# Patient Record
Sex: Male | Born: 1985 | Race: Black or African American | Hispanic: No | Marital: Single | Smoking: Former smoker
Health system: Southern US, Community
[De-identification: ages and names within clinical notes are randomized; demographics above are authoritative.]

## PROBLEM LIST (undated history)

## (undated) DIAGNOSIS — E669 Obesity, unspecified: Secondary | ICD-10-CM

---
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
## 2009-08-22 ENCOUNTER — Emergency Department (HOSPITAL_COMMUNITY): Admission: EM | Admit: 2009-08-22 | Discharge: 2009-08-22 | Payer: Self-pay | Admitting: Emergency Medicine

## 2011-04-20 IMAGING — CR DG RIBS W/ CHEST 3+V*L*
4 series · 4 of 4 positions shown · non-contrast
Comparison: None.

CLINICAL DATA: Nausea, left rib pain

LEFT RIBS AND CHEST - 3+ VIEW

[w chest pa *]
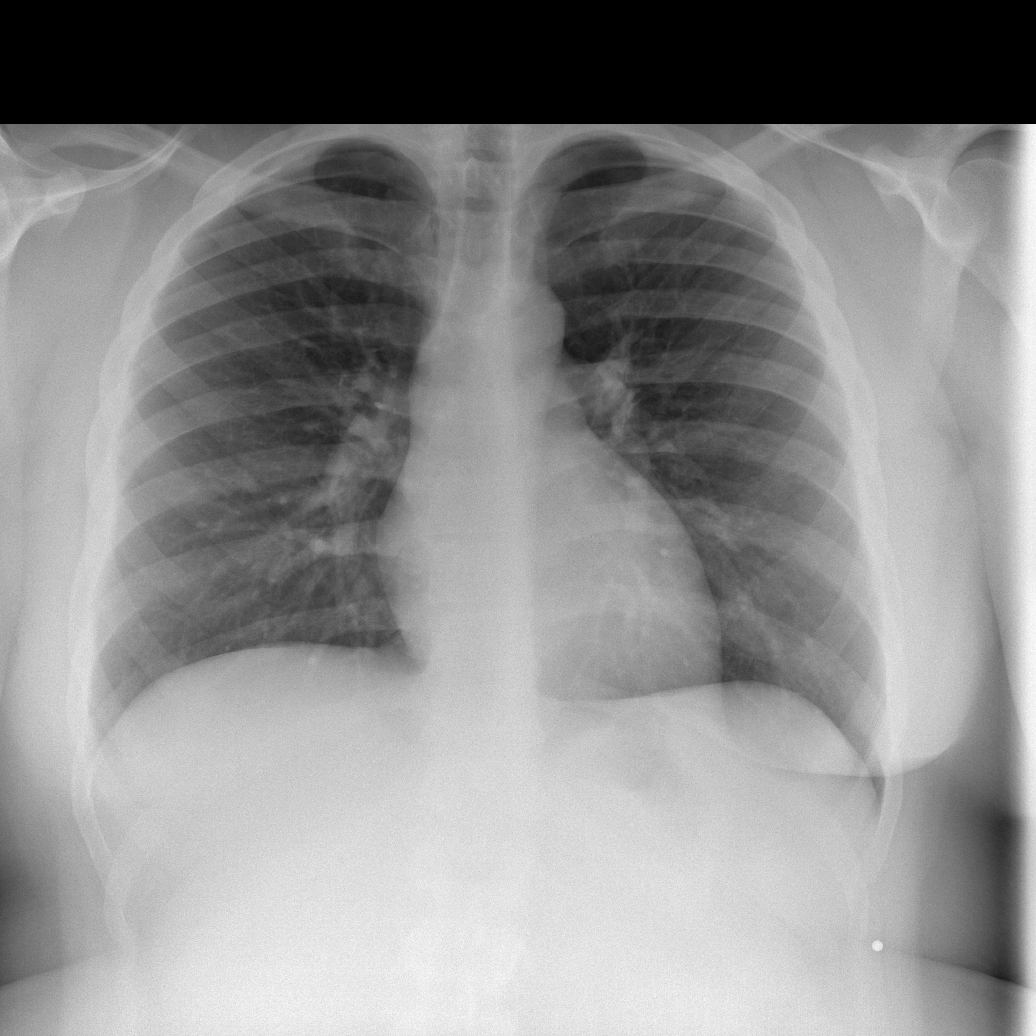

[w ribs ap/pa upper left * (1 of 2)]
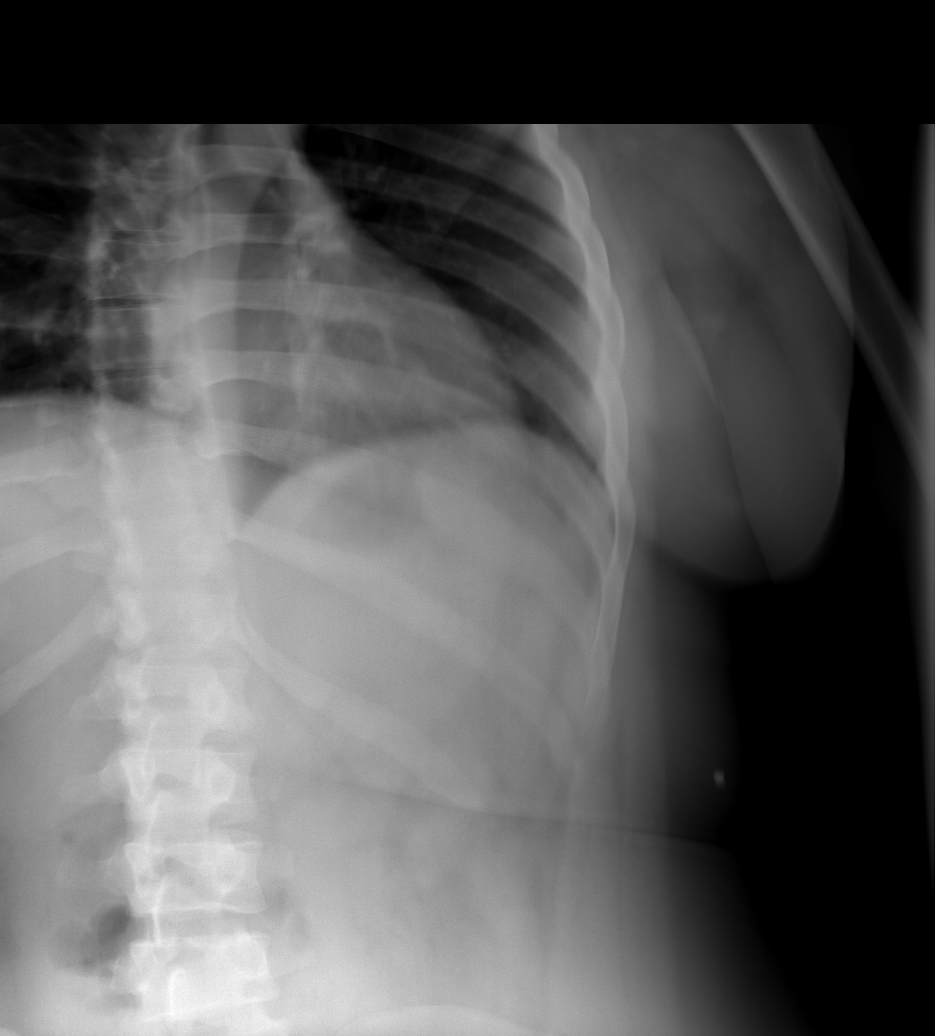

[w ribs ap/pa upper left * (2 of 2)]
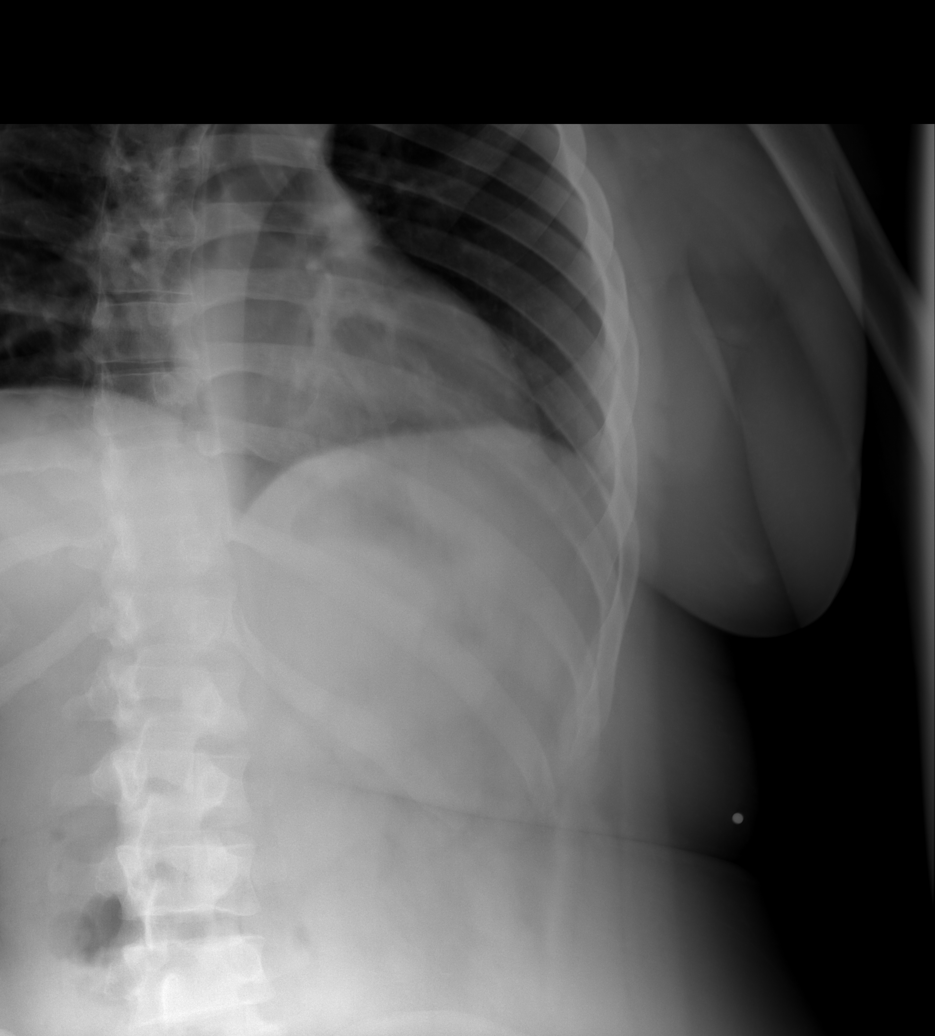

[w ribs ap/pa lower left *]
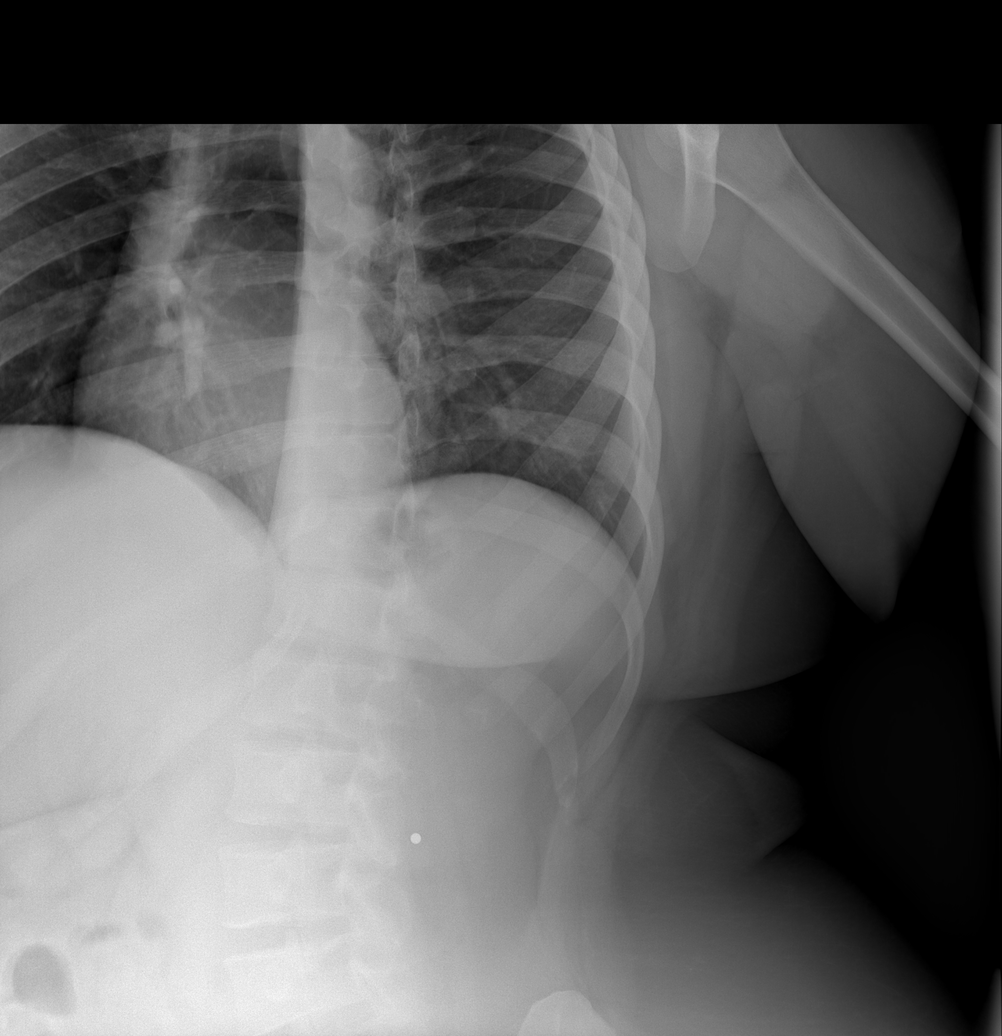

[4 of 4 positions shown; findings below may reference images not displayed]

FINDINGS: Cardiomediastinal silhouette is unremarkable.  No acute
infiltrate or edema.  No left-sided rib fracture is noted.  No
diagnostic pneumothorax.
IMPRESSION: No left rib fracture.  No diagnostic pneumothorax.

## 2012-11-19 ENCOUNTER — Emergency Department (HOSPITAL_COMMUNITY)
Admission: EM | Admit: 2012-11-19 | Discharge: 2012-11-19 | Disposition: A | Discharge status: Home / Self Care | Payer: Self-pay | Attending: Emergency Medicine | Admitting: Emergency Medicine

## 2012-11-19 ENCOUNTER — Encounter (HOSPITAL_COMMUNITY): Payer: Self-pay | Admitting: Emergency Medicine

## 2012-11-19 DIAGNOSIS — H9209 Otalgia, unspecified ear: Secondary | ICD-10-CM | POA: Insufficient documentation

## 2012-11-19 DIAGNOSIS — J020 Streptococcal pharyngitis: Secondary | ICD-10-CM | POA: Insufficient documentation

## 2012-11-19 DIAGNOSIS — E669 Obesity, unspecified: Secondary | ICD-10-CM | POA: Insufficient documentation

## 2012-11-19 DIAGNOSIS — R599 Enlarged lymph nodes, unspecified: Secondary | ICD-10-CM | POA: Insufficient documentation

## 2012-11-19 HISTORY — DX: Obesity, unspecified: E66.9

## 2012-11-19 LAB — RAPID STREP SCREEN (MED CTR MEBANE ONLY): Streptococcus, Group A Screen (Direct): POSITIVE — AB

## 2012-11-19 MED ORDER — DEXAMETHASONE SODIUM PHOSPHATE 10 MG/ML IJ SOLN
10.0000 mg | Freq: Once | INTRAMUSCULAR | Status: AC
  Administered 2012-11-19: 10 mg via INTRAMUSCULAR
  Filled 2012-11-19: qty 1

## 2012-11-19 MED ORDER — PENICILLIN V POTASSIUM 500 MG PO TABS: 500.0000 mg | ORAL_TABLET | Freq: Four times a day (QID) | ORAL | Status: DC

## 2012-11-19 NOTE — ED Notes (Deleted)
Pt reports having headache, left ear pain and sore throat x 3 days, afebrile. Airway intact.

## 2012-11-19 NOTE — ED Notes (Signed)
Sore throat  Since Monday  Has stuff running down throat

## 2012-11-19 NOTE — ED Provider Notes (Signed)
CSN: [id number]     Arrival date & time 11/19/12  1300 History  This chart was scribed for non-physician practitioner working with Suzi Roots, MD, by Ardelia Mems ED Scribe. This patient was seen in room TR09C/TR09C and the patient's care was started at 3:31 PM.   First MD Initiated Contact with Patient 11/19/12 1514     Chief Complaint  Patient presents with  . Sore Throat    The history is provided by the patient. No language interpreter was used.   HPI Comments: Aaron Hayes is a 27 y.o. male who presents to the Emergency Department complaining of a gradually worsening sore throat over the past 4 days which worsened today. He reports only mild bilateral ear pain as an associated symptom. He states that his pain is worsened with swallowing. He states that he has no chronic medical conditions and he is otherwise healthy. Pt states that he has taken Alka Setlter and sore throat lozenges without relief. He denies cough, congestion, fever or any other symptoms.   Past Medical History  Diagnosis Date  . Obesity    No past surgical history on file. No family history on file. History  Substance Use Topics  . Smoking status: Not on file  . Smokeless tobacco: Not on file  . Alcohol Use: Not on file    Review of Systems  Constitutional: Negative for fever.  HENT: Positive for ear pain and sore throat. Negative for congestion.   Respiratory: Negative for cough.   All other systems reviewed and are negative.   Allergies  Review of patient's allergies indicates no known allergies.  Home Medications   Current Outpatient Rx  Name  Route  Sig  Dispense  Refill  . diphenhydrAMINE (BENADRYL) 25 mg capsule   Oral   Take 25 mg by mouth every 6 (six) hours as needed for itching or allergies.          Triage Vitals: BP 99/66  Pulse 87  Temp(Src) 99.7 F (37.6 C) (Oral)  Resp 20  Ht 6' (1.829 m)  Wt 340 lb (154.223 kg)  BMI 46.1 kg/m2  SpO2 99%  Physical Exam   Nursing note and vitals reviewed. Constitutional: He is oriented to person, place, and time. He appears well-developed and well-nourished. No distress.  HENT:  Head: Normocephalic and atraumatic.  Right Ear: External ear normal.  Left Ear: External ear normal.  Nose: Nose normal.  Mouth/Throat: Uvula is midline and oropharynx is clear and moist.  Bilateral tonsillar enlargement. No trismus, no tongue elevation.   Eyes: Conjunctivae are normal.  Neck: Normal range of motion. No tracheal deviation present.  Cardiovascular: Normal rate, regular rhythm and normal heart sounds.   Pulmonary/Chest: Effort normal and breath sounds normal. No stridor.  Abdominal: Soft. He exhibits no distension. There is no tenderness.  Musculoskeletal: Normal range of motion.  Lymphadenopathy:    He has cervical adenopathy.  Neurological: He is alert and oriented to person, place, and time.  Skin: Skin is warm and dry. He is not diaphoretic.  Psychiatric: He has a normal mood and affect. His behavior is normal.    ED Course   Procedures (including critical care time)  DIAGNOSTIC STUDIES: Oxygen Saturation is 100% on RA, normal by my interpretation.    COORDINATION OF CARE: 3:35 PM- Pt advised of plan for treatment and pt agrees.  Medications  dexamethasone (DECADRON) injection 10 mg (10 mg Intramuscular Given 11/19/12 1547)    Labs Reviewed  RAPID STREP  SCREEN - Abnormal; Notable for the following:    Streptococcus, Group A Screen (Direct) POSITIVE (*)    All other components within normal limits   No results found.  1. Strep pharyngitis     MDM  Pt febrile with tonsillar exudate, cervical lymphadenopathy, & dysphagia; diagnosis of strep. Treated in the Ed with steroids. Rx for PCN.  Pt appears mildly dehydrated, discussed importance of water rehydration. Presentation non concerning for PTA or infxn spread to soft tissue. No trismus or uvula deviation. Specific return precautions discussed. Pt  able to drink water in ED without difficulty with intact air way. Recommended PCP follow up.     I personally performed the services described in this documentation, which was scribed in my presence. The recorded information has been reviewed and is accurate.    Mora Bellman, PA-C 11/19/12 1606

## 2012-11-23 NOTE — ED Provider Notes (Signed)
Medical screening examination/treatment/procedure(s) were performed by non-physician practitioner and as supervising physician I was immediately available for consultation/collaboration.   Suzi Roots, MD 11/23/12 [phone]

## 2015-01-27 ENCOUNTER — Encounter (HOSPITAL_COMMUNITY): Payer: Self-pay

## 2015-01-27 ENCOUNTER — Emergency Department (HOSPITAL_COMMUNITY)
Admission: EM | Admit: 2015-01-27 | Discharge: 2015-01-27 | Disposition: A | Discharge status: Home / Self Care | Payer: Self-pay | Attending: Emergency Medicine | Admitting: Emergency Medicine

## 2015-01-27 DIAGNOSIS — Z792 Long term (current) use of antibiotics: Secondary | ICD-10-CM | POA: Insufficient documentation

## 2015-01-27 DIAGNOSIS — L03011 Cellulitis of right finger: Secondary | ICD-10-CM | POA: Insufficient documentation

## 2015-01-27 DIAGNOSIS — E669 Obesity, unspecified: Secondary | ICD-10-CM | POA: Insufficient documentation

## 2015-01-27 DIAGNOSIS — Z87891 Personal history of nicotine dependence: Secondary | ICD-10-CM | POA: Insufficient documentation

## 2015-01-27 MED ORDER — CEPHALEXIN 500 MG PO CAPS: 500.0000 mg | ORAL_CAPSULE | Freq: Four times a day (QID) | ORAL | Status: DC

## 2015-01-27 NOTE — ED Notes (Signed)
Onset 3 days swelling and pain around right thumb cuticle.  No drainage or fever.

## 2015-01-27 NOTE — Discharge Instructions (Signed)
Take ibuprofen or Tylenol for pain. Soak finger in warm soapy water several times a day. Keflex as prescribed until all gone. Follow-up as needed.   Paronychia Paronychia is an inflammatory reaction involving the folds of the skin surrounding the fingernail. This is commonly caused by an infection in the skin around a nail. The most common cause of paronychia is frequent wetting of the hands (as seen with bartenders, food servers, nurses or others who wet their hands). This makes the skin around the fingernail susceptible to infection by bacteria (germs) or fungus. Other predisposing factors are:  Aggressive manicuring.  Nail biting.  Thumb sucking. The most common cause is a staphylococcal (a type of germ) infection, or a fungal (Candida) infection. When caused by a germ, it usually comes on suddenly with redness, swelling, pus and is often painful. It may get under the nail and form an abscess (collection of pus), or form an abscess around the nail. If the nail itself is infected with a fungus, the treatment is usually prolonged and may require oral medicine for up to one year. Your caregiver will determine the length of time treatment is required. The paronychia caused by bacteria (germs) may largely be avoided by not pulling on hangnails or picking at cuticles. When the infection occurs at the tips of the finger it is called felon. When the cause of paronychia is from the herpes simplex virus (HSV) it is called herpetic whitlow. TREATMENT  When an abscess is present treatment is often incision and drainage. This means that the abscess must be cut open so the pus can get out. When this is done, the following home care instructions should be followed. HOME CARE INSTRUCTIONS   It is important to keep the affected fingers very dry. Rubber or plastic gloves over cotton gloves should be used whenever the hand must be placed in water.  Keep wound clean, dry and dressed as suggested by your caregiver  between warm soaks or warm compresses.  Soak in warm water for fifteen to twenty minutes three to four times per day for bacterial infections. Fungal infections are very difficult to treat, so often require treatment for long periods of time.  For bacterial (germ) infections take antibiotics (medicine which kill germs) as directed and finish the prescription, even if the problem appears to be solved before the medicine is gone.  Only take over-the-counter or prescription medicines for pain, discomfort, or fever as directed by your caregiver. SEEK IMMEDIATE MEDICAL CARE IF:  You have redness, swelling, or increasing pain in the wound.  You notice pus coming from the wound.  You have a fever.  You notice a bad smell coming from the wound or dressing. Document Released: 10/08/2000 Document Revised: 07/07/2011 Document Reviewed: 06/09/2008 [facility] Patient Information 2015 [place], [place]. This information is not intended to replace advice given to you by your health care provider. Make sure you discuss any questions you have with your health care provider.

## 2015-01-27 NOTE — ED Provider Notes (Signed)
CSN: [id number]     Arrival date & time 01/27/15  1429 History   First MD Initiated Contact with Patient 01/27/15 1443     Chief Complaint  Patient presents with  . Nail Problem     (Consider location/radiation/quality/duration/timing/severity/associated sxs/prior Treatment) HPI Aaron Hayes is a 29 y.o. male with no major medical problems, presents to emergency department complaining of swelling to the right thumb. Patient noticed swelling and pain around his fingernail, states started approximately 3 days ago. Worsened since. History of the same. Denies any treatment at home. Denies any fever or chills. Denies any pain with movement of the thumb at any of the joints. States last time he had it he was told it was because he was biting his nails and it had to be drained. Patient states he no longer bites his nails. 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  Past Medical History  Diagnosis Date  . Obesity    History reviewed. No pertinent past surgical history. History reviewed. No pertinent family history. Social History  Substance Use Topics  . Smoking status: Former Games developer  . Smokeless tobacco: None  . Alcohol Use: 12.0 oz/week    20 Cans of beer per week    Review of Systems  Constitutional: Negative for fever and chills.  Skin: Positive for wound.      Allergies  Review of patient's allergies indicates no known allergies.  Home Medications   Prior to Admission medications   Medication Sig Start Date End Date Taking? Authorizing Provider  diphenhydrAMINE (BENADRYL) 25 mg capsule Take 25 mg by mouth every 6 (six) hours as needed for itching or allergies.    Historical Provider, MD  penicillin v potassium (VEETID) 500 MG tablet Take 1 tablet (500 mg total) by mouth 4 (four) times daily. 11/19/12   Junious Silk, PA-C   BP 172/84 mmHg  Pulse 97  Temp(Src) 98.2 F (36.8 C) (Oral)  Resp 20  Ht  (1.803 m)  Wt 387 lb  (175.542 kg)  BMI 54.00 kg/m2  SpO2 100% Physical Exam  Constitutional: He appears well-developed and well-nourished. No distress.  Neck: Neck supple.  Neurological: He is alert.  Skin: Skin is warm and dry.  There are indicated to the right thumb with erythema, swelling, tenderness to the touch. Full range of motion of the finger at all joints otherwise. No evidence of tenosynovitis.  Nursing note and vitals reviewed.   ED Course  Procedures (including critical care time) Labs Review Labs Reviewed - No data to display  Imaging Review No results found. I have personally reviewed and evaluated these images and lab results as part of my medical decision-making.   EKG Interpretation None      INCISION AND DRAINAGE Performed by: Jaynie Crumble A Consent: Verbal consent obtained. Risks and benefits: risks, benefits and alternatives were discussed Type: Paronychia   Body area: right thumb  Anesthesia: No anesthesia  Incision was made with a scalpel.  Drainage: purulent  Drainage amount: Large   Patient tolerance: Patient tolerated the procedure well with no immediate complications.    MDM   Final diagnoses:  Paronychia of thumb, right   Patient with right thumb. A care, no evidence of a felon or tenosynovitis. Incised and drained. Soaked in warm soapy water. Home with Keflex, soaks, follow-up as needed.  Filed Vitals:   01/27/15 1446  BP: 172/84  Pulse: 97  Temp: 98.2 F (36.8 C)  TempSrc: Oral  Resp: 20  Height:  (1.803 m)  Weight: 387 lb (175.542 kg)  SpO2: 100%       Jaynie Crumble, PA-C 01/27/15 2031  Melene Plan, DO 01/28/15 0730

## 2017-08-26 ENCOUNTER — Encounter (HOSPITAL_COMMUNITY): Payer: Self-pay | Admitting: Emergency Medicine

## 2017-08-26 ENCOUNTER — Other Ambulatory Visit: Payer: Self-pay

## 2017-08-26 ENCOUNTER — Emergency Department (HOSPITAL_COMMUNITY)
Admission: EM | Admit: 2017-08-26 | Discharge: 2017-08-26 | Disposition: A | Discharge status: Home / Self Care | Payer: Self-pay | Attending: Emergency Medicine | Admitting: Emergency Medicine

## 2017-08-26 DIAGNOSIS — Z87891 Personal history of nicotine dependence: Secondary | ICD-10-CM | POA: Insufficient documentation

## 2017-08-26 DIAGNOSIS — S51812A Laceration without foreign body of left forearm, initial encounter: Secondary | ICD-10-CM | POA: Insufficient documentation

## 2017-08-26 DIAGNOSIS — Z79899 Other long term (current) drug therapy: Secondary | ICD-10-CM | POA: Insufficient documentation

## 2017-08-26 DIAGNOSIS — Y9301 Activity, walking, marching and hiking: Secondary | ICD-10-CM | POA: Insufficient documentation

## 2017-08-26 DIAGNOSIS — S81811A Laceration without foreign body, right lower leg, initial encounter: Secondary | ICD-10-CM | POA: Insufficient documentation

## 2017-08-26 DIAGNOSIS — Y92414 Local residential or business street as the place of occurrence of the external cause: Secondary | ICD-10-CM | POA: Insufficient documentation

## 2017-08-26 DIAGNOSIS — Y998 Other external cause status: Secondary | ICD-10-CM | POA: Insufficient documentation

## 2017-08-26 DIAGNOSIS — Z23 Encounter for immunization: Secondary | ICD-10-CM | POA: Insufficient documentation

## 2017-08-26 MED ORDER — TETANUS-DIPHTH-ACELL PERTUSSIS 5-2.5-18.5 LF-MCG/0.5 IM SUSP
0.5000 mL | Freq: Once | INTRAMUSCULAR | Status: AC
  Administered 2017-08-26: 0.5 mL via INTRAMUSCULAR
  Filled 2017-08-26: qty 0.5

## 2017-08-26 MED ORDER — LIDOCAINE HCL (PF) 1 % IJ SOLN
INTRAMUSCULAR | Status: AC
  Filled 2017-08-26: qty 30

## 2017-08-26 MED ORDER — LIDOCAINE-EPINEPHRINE (PF) 2 %-1:200000 IJ SOLN
20.0000 mL | Freq: Once | INTRAMUSCULAR | Status: DC
  Filled 2017-08-26: qty 20

## 2017-08-26 NOTE — Discharge Instructions (Addendum)
You may alternate Tylenol 1000 mg every 6 hours as needed for pain and Ibuprofen 800 mg every 8 hours as needed for pain.  Please take Ibuprofen with food. ° ° ° °To find a primary care or specialty doctor please call [phone] or [phone] to access "[facility] Find a Doctor Service." ° °You may also go on the [facility] website at [email or web address] ° °There are also multiple [facility], [facility] and [facility] practices throughout the [place] that are frequently accepting new patients. You may find a clinic that is close to your home and contact them. ° °[facility] -  °[address] °[place] [address] °[phone] ° ° °[facility] -  °[address] °[place] [address] °[phone] ° ° °[facility] - °[address]  °[place] [address] °[phone] ° ° °
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
## 2017-09-05 ENCOUNTER — Ambulatory Visit (HOSPITAL_COMMUNITY)
Admission: EM | Admit: 2017-09-05 | Discharge: 2017-09-05 | Disposition: A | Discharge status: Home / Self Care | Payer: Self-pay | Attending: Family Medicine | Admitting: Family Medicine

## 2017-09-05 ENCOUNTER — Encounter (HOSPITAL_COMMUNITY): Payer: Self-pay | Admitting: Emergency Medicine

## 2017-09-05 DIAGNOSIS — Z5189 Encounter for other specified aftercare: Secondary | ICD-10-CM

## 2017-09-05 DIAGNOSIS — Z4802 Encounter for removal of sutures: Secondary | ICD-10-CM

## 2017-09-05 DIAGNOSIS — S81832A Puncture wound without foreign body, left lower leg, initial encounter: Secondary | ICD-10-CM

## 2017-09-05 DIAGNOSIS — L089 Local infection of the skin and subcutaneous tissue, unspecified: Secondary | ICD-10-CM

## 2017-09-05 DIAGNOSIS — T148XXA Other injury of unspecified body region, initial encounter: Principal | ICD-10-CM

## 2017-09-05 MED ORDER — DOXYCYCLINE HYCLATE 100 MG PO TABS: 100.0000 mg | ORAL_TABLET | Freq: Two times a day (BID) | ORAL | 0 refills | Status: DC

## 2017-09-05 NOTE — Discharge Instructions (Addendum)
Please return Tuesday for removal of staples  Gently apply soap and water to the wound three times a day  Return before Tuesday if the drainage increases or the wound is redder and more painful.
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
## 2017-09-11 ENCOUNTER — Ambulatory Visit (HOSPITAL_COMMUNITY)
Admission: EM | Admit: 2017-09-11 | Discharge: 2017-09-11 | Disposition: A | Discharge status: Home / Self Care | Payer: Self-pay

## 2017-09-11 ENCOUNTER — Encounter (HOSPITAL_COMMUNITY): Payer: Self-pay | Admitting: *Deleted

## 2017-09-11 NOTE — ED Triage Notes (Signed)
Area assess by PA Hallie. Staples midly hard to remove and discomfort noted by patient. Area approximately 2 inch in diameter noted around laceration of redness. Area underneath staples hard but no drainage like pus noted when area squeezed. Patient reports itchiness but no pain to area. No fevers at home. 10 staples removed to right uppder middle aspect of leg. 3 1/4in steri strips placed to the interior aspect of the laceration as area has mild dehiscence. Patient given some for home use and instructed to keep placing for the next 5-7 days.   Patient is taking antibiotics and patient instructed to continue taking. Patient and I discussed signs and symptoms of increased infection to include streaking and monitoring the site for increased redness or any drainage.

## 2017-09-23 ENCOUNTER — Encounter (HOSPITAL_COMMUNITY): Payer: Self-pay | Admitting: Emergency Medicine

## 2017-09-23 ENCOUNTER — Ambulatory Visit (HOSPITAL_COMMUNITY)
Admission: EM | Admit: 2017-09-23 | Discharge: 2017-09-23 | Disposition: A | Discharge status: Home / Self Care | Payer: Self-pay | Attending: Family Medicine | Admitting: Family Medicine

## 2017-09-23 DIAGNOSIS — S81801D Unspecified open wound, right lower leg, subsequent encounter: Secondary | ICD-10-CM

## 2017-09-23 MED ORDER — SULFAMETHOXAZOLE-TRIMETHOPRIM 800-160 MG PO TABS
1.0000 | ORAL_TABLET | Freq: Two times a day (BID) | ORAL | 0 refills | Status: AC
Start: 2017-09-23 — End: 2017-10-03

## 2017-09-23 NOTE — ED Triage Notes (Signed)
Pt had stapes removed from his R Lower leg on 5/17, given antibiotics for it, states the area is still infected and draining.

## 2017-10-08 NOTE — ED Provider Notes (Signed)
[facility]   [id number] 09/23/17 Arrival Time: 1903  ASSESSMENT & PLAN:  1. Leg wound, right, subsequent encounter     Meds ordered this encounter  Medications  . sulfamethoxazole-trimethoprim (BACTRIM DS,SEPTRA DS) 800-160 MG tablet    Sig: Take 1 tablet by mouth 2 (two) times daily for 10 days.    Dispense:  20 tablet    Refill:  0   Follow-up Information    [facility]   Specialty:  Urgent Care Why:  If symptoms worsen. Contact information: [address] [place] [id number] [phone]         Reviewed expectations re: course of current medical issues. Questions answered. Outlined signs and symptoms indicating need for more acute intervention. Patient verbalized understanding. After Visit Summary given.  SUBJECTIVE: History from: patient. Aaron Hayes is a 32 y.o. male who reports having staples removed from his RLE wound on 5/17. Wound is still draining. Questions infected. Tender. No fever. Able to bear weight. No extremity sensation changes or weakness. No OTC treatment. No specific aggravating or alleviating factors reported.  ROS: As per HPI.   OBJECTIVE:  Vitals:   09/23/17 1934  BP: (!) 172/104  Pulse: 81  Resp: 18  Temp: 98.1 F (36.7 C)  SpO2: 100%    General appearance: alert; no distress Extremities: warm and well perfused; wound over RLE is slight open with yellowish discharge; no bleeding; some tenderness to touch CV: normal extremity capillary refill Skin: warm and dry Neurologic: normal gait; normal sensation in all extremities Psychological: alert and cooperative; normal mood and affect  No Known Allergies  Past Medical History:  Diagnosis Date  . Obesity    Social History   Socioeconomic History  . Marital status: Single    Spouse name: Not on file  . Number of children: Not on file  . Years of education: Not on file  . Highest education level: Not on  file  Occupational History  . Not on file  Social Needs  . Financial resource strain: Not on file  . Food insecurity:    Worry: Not on file    Inability: Not on file  . Transportation needs:    Medical: Not on file    Non-medical: Not on file  Tobacco Use  . Smoking status: Former Games developermoker  . Smokeless tobacco: Never Used  Substance and Sexual Activity  . Alcohol use: Yes    Alcohol/week: 12.0 oz    Types: 20 Cans of beer per week  . Drug use: No  . Sexual activity: Not on file  Lifestyle  . Physical activity:    Days per week: Not on file    Minutes per session: Not on file  . Stress: Not on file  Relationships  . Social connections:    Talks on phone: Not on file    Gets together: Not on file    Attends religious service: Not on file    Active member of club or organization: Not on file    Attends meetings of clubs or organizations: Not on file    Relationship status: Not on file  . Intimate partner violence:    Fear of current or ex partner: Not on file    Emotionally abused: Not on file    Physically abused: Not on file    Forced sexual activity: Not on file  Other Topics Concern  . Not on file  Social History Narrative  . Not  on file   No family history on file. History reviewed. No pertinent surgical history.    Mardella Layman, MD 10/08/17 1055

## 2020-10-07 ENCOUNTER — Other Ambulatory Visit: Payer: Self-pay

## 2020-10-07 ENCOUNTER — Ambulatory Visit (HOSPITAL_COMMUNITY)
Admission: EM | Admit: 2020-10-07 | Discharge: 2020-10-07 | Disposition: A | Discharge status: Home / Self Care | Payer: Self-pay | Attending: Internal Medicine | Admitting: Internal Medicine

## 2020-10-07 ENCOUNTER — Encounter (HOSPITAL_COMMUNITY): Payer: Self-pay | Admitting: *Deleted

## 2020-10-07 DIAGNOSIS — H1132 Conjunctival hemorrhage, left eye: Secondary | ICD-10-CM

## 2020-10-07 NOTE — ED Provider Notes (Signed)
[facility]    CSN: [id number] Arrival date & time: 10/07/20  1224      History   Chief Complaint Chief Complaint  Patient presents with   Eye Drainage    HPI Aaron Hayes is a 35 y.o. male.   HPI  Eye redness: Patient states that about 4 days ago he noticed eye redness.  Symptoms started after he was "gagging a few times at night".  He denies any eye trauma, head trauma, headaches eyeball pain, visual changes although he is does state that he might have some blurriness of the vision. He reports no significant eye discharge. He has not tried anything for symptoms.   Past Medical History:  Diagnosis Date   Obesity     There are no problems to display for this patient.   History reviewed. No pertinent surgical history.   Home Medications    Prior to Admission medications   Medication Sig Start Date End Date Taking? Authorizing Provider  diphenhydrAMINE (BENADRYL) 25 mg capsule Take 25 mg by mouth every 6 (six) hours as needed for itching or allergies.    [provider]    Family History History reviewed. No pertinent family history.  Social History Social History   Tobacco Use   Smoking status: Former    Pack years: 0.00   Smokeless tobacco: Never  Substance Use Topics   Alcohol use: Yes    Alcohol/week: 20.0 standard drinks    Types: 20 Cans of beer per week   Drug use: No     Allergies   Patient has no known allergies.   Review of Systems Review of Systems  As stated above in HPI Physical Exam Triage Vital Signs ED Triage Vitals  Enc Vitals Group     BP 10/07/20 1238 (!) 154/108     Pulse Rate 10/07/20 1238 86     Resp 10/07/20 1238 18     Temp 10/07/20 1238 98 F (36.7 C)     Temp Source 10/07/20 1238 Oral     SpO2 10/07/20 1238 96 %     Weight --      Height --      Head Circumference --      Peak Flow --      Pain Score 10/07/20 1236 4     Pain Loc --      Pain Edu? --      Excl. in GC? --    No data  found.  Updated Vital Signs BP (!) 154/108   Pulse 86   Temp 98 F (36.7 C) (Oral)   Resp 18   SpO2 96%   Visual Acuity Right Eye Distance: 20/50 Left Eye Distance: 20/70 Bilateral Distance: 20/40  Right Eye Near:   Left Eye Near:    Bilateral Near:     Physical Exam Vitals and nursing note reviewed.  Constitutional:      Appearance: Normal appearance.  HENT:     Head: Normocephalic and atraumatic.     Right Ear: Tympanic membrane normal.     Left Ear: Tympanic membrane normal.     Nose: Nose normal.  Eyes:     General: Lids are normal. Lids are everted, no foreign bodies appreciated. Vision grossly intact. Gaze aligned appropriately.        Right eye: No foreign body, discharge or hordeolum.        Left eye: No foreign body, discharge or hordeolum.     Extraocular Movements: Extraocular movements  intact.     Right eye: Normal extraocular motion and no nystagmus.     Left eye: Normal extraocular motion and no nystagmus.     Conjunctiva/sclera:     Right eye: Right conjunctiva is not injected. No chemosis, exudate or hemorrhage.    Left eye: Left conjunctiva is not injected. Hemorrhage present. No chemosis or exudate.    Pupils: Pupils are equal, round, and reactive to light.   Neurological:     Mental Status: He is alert.     UC Treatments / Results  Labs (all labs ordered are listed, but only abnormal results are displayed) Labs Reviewed - No data to display  EKG   Radiology No results found.  Procedures Procedures (including critical care time)  Medications Ordered in UC Medications - No data to display  Initial Impression / Assessment and Plan / UC Course  I have reviewed the triage vital signs and the nursing notes.  Pertinent labs & imaging results that were available during my care of the patient were reviewed by me and considered in my medical decision making (see chart for details).     New.  Conjunctival hemorrhage with patient.  I have  recommended that he have follow-up with a PCP to monitor his blood pressure and to avoid any episodes like vomiting that may cause this to recur.  I informed him that this is not contagious and that it can take a few weeks for the pigment to reabsorb into the eye.  Nothing that he needs to do at this point.  Discussed red flag signs and symptoms.  Patient asked for a return to work note for his next shift which she states is the 14th. Final Clinical Impressions(s) / UC Diagnoses   Final diagnoses:  Conjunctival hemorrhage of left eye   Discharge Instructions   None    ED Prescriptions   None    PDMP not reviewed this encounter.   Rushie Chestnut, [place] 10/07/20 1324

## 2020-10-07 NOTE — ED Triage Notes (Signed)
Pt report eye drainage since Friday.

## 2021-09-19 ENCOUNTER — Encounter (HOSPITAL_COMMUNITY): Payer: Self-pay | Admitting: Emergency Medicine

## 2021-09-19 ENCOUNTER — Ambulatory Visit (HOSPITAL_COMMUNITY)
Admission: EM | Admit: 2021-09-19 | Discharge: 2021-09-19 | Disposition: A | Discharge status: Home / Self Care | Payer: Self-pay | Attending: Family Medicine | Admitting: Family Medicine

## 2021-09-19 DIAGNOSIS — H1012 Acute atopic conjunctivitis, left eye: Secondary | ICD-10-CM

## 2021-09-19 DIAGNOSIS — R030 Elevated blood-pressure reading, without diagnosis of hypertension: Secondary | ICD-10-CM

## 2021-09-19 MED ORDER — OLOPATADINE HCL 0.2 % OP SOLN: 1.0000 [drp] | Freq: Every day | OPHTHALMIC | 0 refills | Status: AC

## 2021-09-19 NOTE — ED Triage Notes (Signed)
Pt reports left eye redness and irritation since yesterday.

## 2021-09-19 NOTE — ED Provider Notes (Signed)
[facility]   [id number] 09/19/21 Arrival Time: 1200  ASSESSMENT & PLAN:  1. Allergic conjunctivitis of left eye   2. Elevated blood pressure reading in office without diagnosis of hypertension    Meds ordered this encounter  Medications   Olopatadine HCl 0.2 % SOLN    Sig: Apply 1 drop to eye daily.    Dispense:  2.5 mL    Refill:  0   Work note provided. Warm compress to eye(s). Local eye care discussed.    Discharge Instructions      Your blood pressure was noted to be elevated during your visit today. If you are currently taking medication for high blood pressure, please ensure you are taking this as directed. If you do not have a history of high blood pressure and your blood pressure remains persistently elevated, you may need to begin taking a medication at some point. You may return here within the next few days to recheck if unable to see your primary care provider or if you do not have a one.  BP (!) 185/109 (BP Location: Right Wrist)   Pulse (!) 59   Temp 98.1 F (36.7 C) (Oral)   Resp 16   Ht 5\' 11"  (1.803 m)   Wt (!) 175.5 kg   SpO2 99%   BMI 53.96 kg/m   BP Readings from Last 3 Encounters:  09/19/21 (!) 185/109  10/07/20 (!) 154/108  09/23/17 (!) 172/104      No s/s of HTN urgency. Plans to est PCP for f/u.  Reviewed expectations re: course of current medical issues. Questions answered. Outlined signs and symptoms indicating need for more acute intervention. Patient verbalized understanding. After Visit Summary given.   SUBJECTIVE:  Aaron Hayes is a 36 y.o. male who presents with complaint of persistent mild LEFT eye irritation/redness/watery drainage.  Onset gradual, approximately  2 d ago . Injury/trauma: denied. Visual changes: no. Contact lens use: no. Recent illness: no. Self treatment: Visine gtts with minimal help;.  Increased blood pressure noted today. Reports that he has not been treated for hypertension in the  past.  He reports no chest pain on exertion, no dyspnea on exertion, no swelling of ankles, no orthostatic dizziness or lightheadedness, no orthopnea or paroxysmal nocturnal dyspnea, and no palpitations.   OBJECTIVE:  Vitals:   09/19/21 1308 09/19/21 1309  BP:  (!) 185/109  Pulse:  (!) 59  Resp:  16  Temp:  98.1 F (36.7 C)  TempSrc:  Oral  SpO2:  99%  Weight: (!) 175.5 kg   Height: 5\' 11"  (1.803 m)     General appearance: alert; no distress HEENT: [place]; AT; PERRLA; no restriction of the extraocular movements OS: without reported pain; with mild/1+ conjunctival injection; with watery drainage; without corneal opacities; without limbal flush; without periorbital swelling or erythema Neck: supple without LAD Skin: warm and dry Psychological: alert and cooperative; normal mood and affect    Visual Acuity  Right Eye Distance: 20/100 (states he needs glasses and does not wear them) Left Eye Distance: 20/50 Bilateral Distance: 20/50  Right Eye Near:   Left Eye Near:    Bilateral Near:     No Known Allergies  Past Medical History:  Diagnosis Date   Obesity    Social History   Socioeconomic History   Marital status: Single    Spouse name: Not on file   Number of children: Not on file   Years of education: Not on file   Highest  education level: Not on file  Occupational History   Not on file  Tobacco Use   Smoking status: Former   Smokeless tobacco: Never  Substance and Sexual Activity   Alcohol use: Yes    Alcohol/week: 20.0 standard drinks    Types: 20 Cans of beer per week   Drug use: No   Sexual activity: Not on file  Other Topics Concern   Not on file  Social History Narrative   Not on file   Social Determinants of Health   Financial Resource Strain: Not on file  Food Insecurity: Not on file  Transportation Needs: Not on file  Physical Activity: Not on file  Stress: Not on file  Social Connections: Not on file  Intimate Partner Violence: Not on file    History reviewed. No pertinent family history. History reviewed. No pertinent surgical history.    Vanessa Kick, MD 09/19/21 1425

## 2021-09-19 NOTE — Discharge Instructions (Addendum)
Your blood pressure was noted to be elevated during your visit today. If you are currently taking medication for high blood pressure, please ensure you are taking this as directed. If you do not have a history of high blood pressure and your blood pressure remains persistently elevated, you may need to begin taking a medication at some point. You may return here within the next few days to recheck if unable to see your primary care provider or if you do not have a one.  BP (!) 185/109 (BP Location: Right Wrist)   Pulse (!) 59   Temp 98.1 F (36.7 C) (Oral)   Resp 16   Ht 5\' 11"  (1.803 m)   Wt (!) 175.5 kg   SpO2 99%   BMI 53.96 kg/m   BP Readings from Last 3 Encounters:  09/19/21 (!) 185/109  10/07/20 (!) 154/108  09/23/17 (!) 172/104

## 2021-11-17 ENCOUNTER — Encounter (HOSPITAL_COMMUNITY): Payer: Self-pay

## 2021-11-17 ENCOUNTER — Ambulatory Visit (HOSPITAL_COMMUNITY)
Admission: EM | Admit: 2021-11-17 | Discharge: 2021-11-17 | Disposition: A | Discharge status: Home / Self Care | Payer: Self-pay | Attending: Emergency Medicine | Admitting: Emergency Medicine

## 2021-11-17 DIAGNOSIS — Z202 Contact with and (suspected) exposure to infections with a predominantly sexual mode of transmission: Secondary | ICD-10-CM | POA: Insufficient documentation

## 2021-11-17 DIAGNOSIS — Z113 Encounter for screening for infections with a predominantly sexual mode of transmission: Secondary | ICD-10-CM | POA: Insufficient documentation

## 2021-11-17 MED ORDER — METRONIDAZOLE 500 MG PO TABS: 2000.0000 mg | ORAL_TABLET | Freq: Once | ORAL | 0 refills | Status: AC

## 2021-11-17 NOTE — ED Provider Notes (Signed)
[facility]    CSN: [id number] Arrival date & time: 11/17/21  1047      History   Chief Complaint Chief Complaint  Patient presents with   SEXUALLY TRANSMITTED DISEASE    HPI ISSAK GOLEY is a 36 y.o. male.   Presents requesting STI testing.  Known exposure to trichomoniasis.  Sexually active, 4 partners within the last 2 months, sometimes condom use.denies all symptoms.    Past Medical History:  Diagnosis Date   Obesity     There are no problems to display for this patient.   History reviewed. No pertinent surgical history.     Home Medications    Prior to Admission medications   Medication Sig Start Date End Date Taking? Authorizing Provider  diphenhydrAMINE (BENADRYL) 25 mg capsule Take 25 mg by mouth every 6 (six) hours as needed for itching or allergies.    [provider]  Olopatadine HCl 0.2 % SOLN Apply 1 drop to eye daily. 09/19/21   Mardella Layman, MD    Family History History reviewed. No pertinent family history.  Social History Social History   Tobacco Use   Smoking status: Former   Smokeless tobacco: Never  Substance Use Topics   Alcohol use: Yes    Alcohol/week: 20.0 standard drinks of alcohol    Types: 20 Cans of beer per week   Drug use: No     Allergies   Patient has no known allergies.   Review of Systems Review of Systems  Constitutional: Negative.   Respiratory: Negative.    Cardiovascular: Negative.   Genitourinary: Negative.   Skin: Negative.      Physical Exam Triage Vital Signs ED Triage Vitals [11/17/21 1108]  Enc Vitals Group     BP (!) 154/117     Pulse Rate 86     Resp 18     Temp 98.3 F (36.8 C)     Temp Source Oral     SpO2 98 %     Weight      Height      Head Circumference      Peak Flow      Pain Score      Pain Loc      Pain Edu?      Excl. in GC?    No data found.  Updated Vital Signs BP (!) 154/117 (BP Location: Left Arm)   Pulse 86   Temp 98.3 F (36.8 C)  (Oral)   Resp 18   SpO2 98%   Visual Acuity Right Eye Distance:   Left Eye Distance:   Bilateral Distance:    Right Eye Near:   Left Eye Near:    Bilateral Near:     Physical Exam Constitutional:      Appearance: Normal appearance.  Eyes:     Extraocular Movements: Extraocular movements intact.  Pulmonary:     Effort: Pulmonary effort is normal.  Genitourinary:    Comments: deferred Neurological:     Mental Status: He is alert and oriented to person, place, and time. Mental status is at baseline.  Psychiatric:        Mood and Affect: Mood normal.        Behavior: Behavior normal.      UC Treatments / Results  Labs (all labs ordered are listed, but only abnormal results are displayed) Labs Reviewed - No data to display  EKG   Radiology No results found.  Procedures Procedures (including critical care time)  Medications Ordered in UC Medications - No data to display  Initial Impression / Assessment and Plan / UC Course  I have reviewed the triage vital signs and the nursing notes.  Pertinent labs & imaging results that were available during my care of the patient were reviewed by me and considered in my medical decision making (see chart for details).  Routine screening for STI, exposure to trichomoniasis  We will treat prophylactically, metronidazole to 1000 mg once prescribed, discussed administration, STI labs are pending, will treat further per protocol, advised abstinence for 7 days posttreatment and until all labs have resulted, may follow-up with urgent care as needed Final Clinical Impressions(s) / UC Diagnoses   Final diagnoses:  None   Discharge Instructions   None    ED Prescriptions   None    PDMP not reviewed this encounter.   Valinda Hoar, NP 11/17/21 1126

## 2021-11-17 NOTE — Discharge Instructions (Signed)
Today you have been treated prophylactically for trichomoniasis  Take all 4 metronidazole pills together at once, refrain from having sex for 7 days  Please notify all partners if positive for any infections  Labs pending 2-3 days, you will be contacted if positive for any sti and treatment will be sent to the pharmacy, you will have to return to the clinic if positive for gonorrhea to receive treatment   Please refrain from having sex until labs results, if positive please refrain from having sex until treatment complete and symptoms resolve   If positive for , Chlamydia  gonorrhea or trichomoniasis please notify partner or partners so they may tested as well  Moving forward, it is recommended you use some form of protection against the transmission of sti infections  such as condoms or dental dams with each sexual encounter

## 2021-11-17 NOTE — ED Triage Notes (Signed)
Pt reports exposure to Trich and would like to be treated. Pt reports no discharge.

## 2021-11-18 LAB — CYTOLOGY, (ORAL, ANAL, URETHRAL) ANCILLARY ONLY
Chlamydia: NEGATIVE
Comment: NEGATIVE
Comment: NEGATIVE
Comment: NORMAL
Neisseria Gonorrhea: NEGATIVE
Trichomonas: NEGATIVE
